# Patient Record
Sex: Male | Born: 1935 | Race: Black or African American | Hispanic: No | Smoking: Never smoker
Health system: Southern US, Community
[De-identification: ages and names within clinical notes are randomized; demographics above are authoritative.]

## PROBLEM LIST (undated history)

## (undated) DIAGNOSIS — F0390 Unspecified dementia without behavioral disturbance: Secondary | ICD-10-CM

## (undated) DIAGNOSIS — F0280 Dementia in other diseases classified elsewhere without behavioral disturbance: Secondary | ICD-10-CM

## (undated) DIAGNOSIS — G20 Parkinson's disease: Secondary | ICD-10-CM

## (undated) DIAGNOSIS — E119 Type 2 diabetes mellitus without complications: Secondary | ICD-10-CM

## (undated) DIAGNOSIS — N429 Disorder of prostate, unspecified: Secondary | ICD-10-CM

## (undated) DIAGNOSIS — R569 Unspecified convulsions: Secondary | ICD-10-CM

---

## 2006-06-21 ENCOUNTER — Inpatient Hospital Stay (HOSPITAL_COMMUNITY): Admission: EM | Admit: 2006-06-21 | Discharge: 2006-06-25 | Payer: Self-pay | Admitting: Emergency Medicine

## 2010-09-11 ENCOUNTER — Encounter: Payer: Self-pay | Admitting: Internal Medicine

## 2019-10-06 ENCOUNTER — Emergency Department (HOSPITAL_COMMUNITY): Payer: Medicare Other

## 2019-10-06 ENCOUNTER — Emergency Department (HOSPITAL_COMMUNITY)
Admission: EM | Admit: 2019-10-06 | Discharge: 2019-10-06 | Disposition: A | Discharge status: Home / Self Care | Payer: Medicare Other | Attending: Emergency Medicine | Admitting: Emergency Medicine

## 2019-10-06 ENCOUNTER — Encounter (HOSPITAL_COMMUNITY): Payer: Self-pay | Admitting: *Deleted

## 2019-10-06 ENCOUNTER — Other Ambulatory Visit: Payer: Self-pay

## 2019-10-06 DIAGNOSIS — S79911A Unspecified injury of right hip, initial encounter: Secondary | ICD-10-CM | POA: Diagnosis present

## 2019-10-06 DIAGNOSIS — R0789 Other chest pain: Secondary | ICD-10-CM | POA: Insufficient documentation

## 2019-10-06 DIAGNOSIS — Y999 Unspecified external cause status: Secondary | ICD-10-CM | POA: Diagnosis not present

## 2019-10-06 DIAGNOSIS — Z7984 Long term (current) use of oral hypoglycemic drugs: Secondary | ICD-10-CM | POA: Diagnosis not present

## 2019-10-06 DIAGNOSIS — G20 Parkinson's disease: Secondary | ICD-10-CM | POA: Diagnosis not present

## 2019-10-06 DIAGNOSIS — S7001XA Contusion of right hip, initial encounter: Secondary | ICD-10-CM | POA: Insufficient documentation

## 2019-10-06 DIAGNOSIS — W19XXXA Unspecified fall, initial encounter: Secondary | ICD-10-CM | POA: Insufficient documentation

## 2019-10-06 DIAGNOSIS — Y9389 Activity, other specified: Secondary | ICD-10-CM | POA: Diagnosis not present

## 2019-10-06 DIAGNOSIS — G309 Alzheimer's disease, unspecified: Secondary | ICD-10-CM | POA: Diagnosis not present

## 2019-10-06 DIAGNOSIS — M25551 Pain in right hip: Secondary | ICD-10-CM

## 2019-10-06 DIAGNOSIS — F0280 Dementia in other diseases classified elsewhere without behavioral disturbance: Secondary | ICD-10-CM | POA: Diagnosis not present

## 2019-10-06 DIAGNOSIS — Y9289 Other specified places as the place of occurrence of the external cause: Secondary | ICD-10-CM | POA: Insufficient documentation

## 2019-10-06 HISTORY — DX: Type 2 diabetes mellitus without complications: E11.9

## 2019-10-06 HISTORY — DX: Disorder of prostate, unspecified: N42.9

## 2019-10-06 HISTORY — DX: Parkinson's disease: G20

## 2019-10-06 HISTORY — DX: Dementia in other diseases classified elsewhere, unspecified severity, without behavioral disturbance, psychotic disturbance, mood disturbance, and anxiety: F02.80

## 2019-10-06 HISTORY — DX: Unspecified convulsions: R56.9

## 2019-10-06 HISTORY — DX: Unspecified dementia, unspecified severity, without behavioral disturbance, psychotic disturbance, mood disturbance, and anxiety: F03.90

## 2019-10-06 NOTE — ED Provider Notes (Signed)
[facility] EMERGENCY DEPARTMENT Provider Note   CSN: [id number] Arrival date & time: 10/06/19  1242     History Chief Complaint  Patient presents with  . Hip Pain    Reon Hunley is a 86 y.o. male.  Patient states he tripped and fell on his right side hurts in his right lower chest and his right hip  The history is provided by the patient. No language interpreter was used.  Hip Pain This is a new problem. The current episode started 12 to 24 hours ago. The problem occurs rarely. The problem has been resolved. Associated symptoms include chest pain. Pertinent negatives include no abdominal pain and no headaches. Nothing aggravates the symptoms. Nothing relieves the symptoms.       Past Medical History:  Diagnosis Date  . Alzheimer disease (HCC)   . Dementia (HCC)   . Diabetes mellitus without complication (HCC)   . Parkinson disease (HCC)   . Prostate disease   . Seizures (HCC)     There are no problems to display for this patient.   Past Surgical History:  Procedure Laterality Date  . HERNIA REPAIR    . urological surgery         No family history on file.  Social History   Tobacco Use  . Smoking status: Never Smoker  . Smokeless tobacco: Never Used  Substance Use Topics  . Alcohol use: Not Currently  . Drug use: Never    Home Medications Prior to Admission medications   Medication Sig Start Date End Date Taking? Authorizing Provider  cetirizine (ZYRTEC) 5 MG tablet Take 5 mg by mouth at bedtime. 09/18/19  Yes [provider]  divalproex (DEPAKOTE ER) 500 MG 24 hr tablet Take 1 tablet by mouth at bedtime.  09/30/19  Yes [provider]  finasteride (PROSCAR) 5 MG tablet Take 5 mg by mouth daily. 09/18/19  Yes [provider]  JANUVIA 50 MG tablet Take 50 mg by mouth daily. 06/15/19  Yes [provider]  lamoTRIgine (LAMICTAL) 100 MG tablet Take 1 tablet by mouth in the morning and at bedtime. 10/04/19  Yes [provider]  levothyroxine (SYNTHROID) 25 MCG tablet Take 12.5 mcg by mouth daily. 10/01/19  Yes [provider]  Melatonin 1 MG TABS Take 4 tablets by mouth daily. 08/19/19  Yes [provider]  meloxicam (MOBIC) 15 MG tablet Take 15 mg by mouth daily. 09/27/19  Yes [provider]  methylPREDNISolone (MEDROL DOSEPAK) 4 MG TBPK tablet Take 1 tablet by mouth as directed. 09/15/19  Yes [provider]  Multiple Vitamin (MULTIVITAMIN) capsule Take 1 capsule by mouth daily.   Yes [provider]  omega-3 acid ethyl esters (LOVAZA) 1 g capsule Take 1 g by mouth 2 (two) times daily.   Yes [provider]  Omega-3 Fatty Acids (FISH OIL) 1000 MG CAPS Take 1 capsule by mouth daily.   Yes [provider]  QUEtiapine (SEROQUEL) 25 MG tablet Take 1 tablet by mouth at bedtime as needed.  09/07/19  Yes [provider]  tamsulosin (FLOMAX) 0.4 MG CAPS capsule Take 0.4 mg by mouth daily. 10/02/19  Yes [provider]    Allergies    Lovastatin and Procardia [nifedipine]  Review of Systems   Review of Systems  Constitutional: Negative for appetite change and fatigue.  HENT: Negative for congestion, ear discharge and sinus pressure.   Eyes: Negative for discharge.  Respiratory: Negative for cough.   Cardiovascular: Positive  for chest pain.  Gastrointestinal: Negative for abdominal pain and diarrhea.  Genitourinary: Negative for frequency and hematuria.  Musculoskeletal: Negative for back pain.       Tenderness to right hip with bruising  Skin: Negative for rash.  Neurological: Negative for seizures and headaches.  Psychiatric/Behavioral: Negative for hallucinations.    Physical Exam Updated Vital Signs BP (!) 183/87 (BP Location: Right Arm)   Pulse 78   Temp 98.6 F (37 C) (Oral)   Resp 14   SpO2 96%   Physical Exam Vitals and nursing note reviewed.  Constitutional:      Appearance: He is well-developed.  HENT:       Head: Normocephalic.     Nose: Nose normal.  Eyes:     General: No scleral icterus.    Conjunctiva/sclera: Conjunctivae normal.  Neck:     Thyroid: No thyromegaly.  Cardiovascular:     Rate and Rhythm: Normal rate and regular rhythm.     Heart sounds: No murmur. No friction rub. No gallop.      Comments: Tender right lower chest Pulmonary:     Breath sounds: No stridor. No wheezing or rales.  Chest:     Chest wall: No tenderness.  Abdominal:     General: There is no distension.     Tenderness: There is no abdominal tenderness. There is no rebound.  Musculoskeletal:        General: Normal range of motion.     Cervical back: Neck supple.     Comments: Tender right hip with bruising  Lymphadenopathy:     Cervical: No cervical adenopathy.  Skin:    Findings: No erythema or rash.  Neurological:     Mental Status: He is alert and oriented to person, place, and time.     Motor: No abnormal muscle tone.     Coordination: Coordination normal.  Psychiatric:        Behavior: Behavior normal.     ED Results / Procedures / Treatments   Labs (all labs ordered are listed, but only abnormal results are displayed) Labs Reviewed - No data to display  EKG None  Radiology DG Ribs Unilateral W/Chest Right  Result Date: 10/06/2019 CLINICAL DATA:  Larey Seat 2 weeks ago, fell 4 days ago with a repetitive injury in same area EXAM: RIGHT RIBS AND CHEST - 3+ VIEW COMPARISON:  06/22/2006 FINDINGS: Frontal view of the chest as well as oblique views of the right thoracic cage are obtained. There are no acute displaced rib fractures. The cardiac silhouette is unremarkable. Marked ectasia of the thoracic aorta is again noted, with cardiomediastinal contours accentuated by AP supine positioning. Scarring or atelectasis at the right base. No airspace disease, effusion, or pneumothorax. IMPRESSION: 1. No acute intrathoracic process. 2. Chronic tortuosity of the thoracic aorta, not appreciably changed  since 2007. Electronically Signed   By: Sharlet Salina M.D.   On: 10/06/2019 16:56   CT Hip Right Wo Contrast  Result Date: 10/06/2019 CLINICAL DATA:  Right hip pain for 2 weeks since a fall. Initial encounter. EXAM: CT OF THE RIGHT HIP WITHOUT CONTRAST TECHNIQUE: Multidetector CT imaging of the right hip was performed according to the standard protocol. Multiplanar CT image reconstructions were also generated. COMPARISON:  Plain films earlier today. FINDINGS: Bones/Joint/Cartilage There is no acute bony or joint abnormality. Joint space narrowing, subchondral cyst formation in the periphery of the acetabulum and small osteophytes about the humeral head noted. No avascular necrosis of the femoral head. Lower lumbar  spondylosis and degenerative ankylosis of the right SI joint noted. Ligaments Suboptimally assessed by CT. Muscles and Tendons Negative Soft tissues There is a mixed attenuating fluid collection adjacent to the right greater trochanter which measures 9 cm AP x 4 cm transverse x 10 cm craniocaudal consistent with a subacute hematoma. There is some stranding in subcutaneous fat about the collection. IMPRESSION: Negative for fracture or other acute bony or joint abnormality. Large subacute appearing hematoma in the subcutaneous fat adjacent to the right greater trochanter. Right hip osteoarthritis. Electronically Signed   By: Inge Rise M.D.   On: 10/06/2019 16:44   DG Hip Unilat  With Pelvis 2-3 Views Right  Result Date: 10/06/2019 CLINICAL DATA:  Right hip pain after a fall 2 weeks ago. Second fall 4 days ago, initial encounter. EXAM: DG HIP (WITH OR WITHOUT PELVIS) 2-3V RIGHT COMPARISON:  None. FINDINGS: No acute osseous or joint abnormality. Superolateral joint space narrowing in the right hip with osteophytosis. Degenerative changes are seen in the lumbar spine. IMPRESSION: 1. No acute findings. 2. Mild osteoarthritis in the right hip. Electronically Signed   By: Lorin Picket M.D.   On:  10/06/2019 14:02    Procedures Procedures (including critical care time)  Medications Ordered in ED Medications - No data to display  ED Course  I have reviewed the triage vital signs and the nursing notes.  Pertinent labs & imaging results that were available during my care of the patient were reviewed by me and considered in my medical decision making (see chart for details).    MDM Rules/Calculators/A&P                     Chest x-ray and CT scan of the right hip do not show any fractures.  Patient has a large hematoma around his right hip.  He will follow up with orthopedics next week Final Clinical Impression(s) / ED Diagnoses Final diagnoses:  Right hip pain    Rx / DC Orders ED Discharge Orders    None       Milton Ferguson, MD 10/06/19 1713

## 2019-10-06 NOTE — ED Triage Notes (Signed)
Fell 2 weeks ago and injured hip and had negative xrays at that time, fell again 4 days ago and re-injured the same area. Pain in right hip.

## 2019-10-06 NOTE — Discharge Instructions (Signed)
Take Tylenol for pain.  Follow-up with an orthopedic surgeon in the next week

## 2021-05-10 IMAGING — DX DG HIP (WITH OR WITHOUT PELVIS) 2-3V*R*
3 series · 3 of 3 positions shown · non-contrast
Comparison: None.

CLINICAL DATA: Right hip pain after a fall 2 weeks ago. Second fall
4 days ago, initial encounter.

EXAM:
DG HIP (WITH OR WITHOUT PELVIS) 2-3V RIGHT

[pelvis ap]
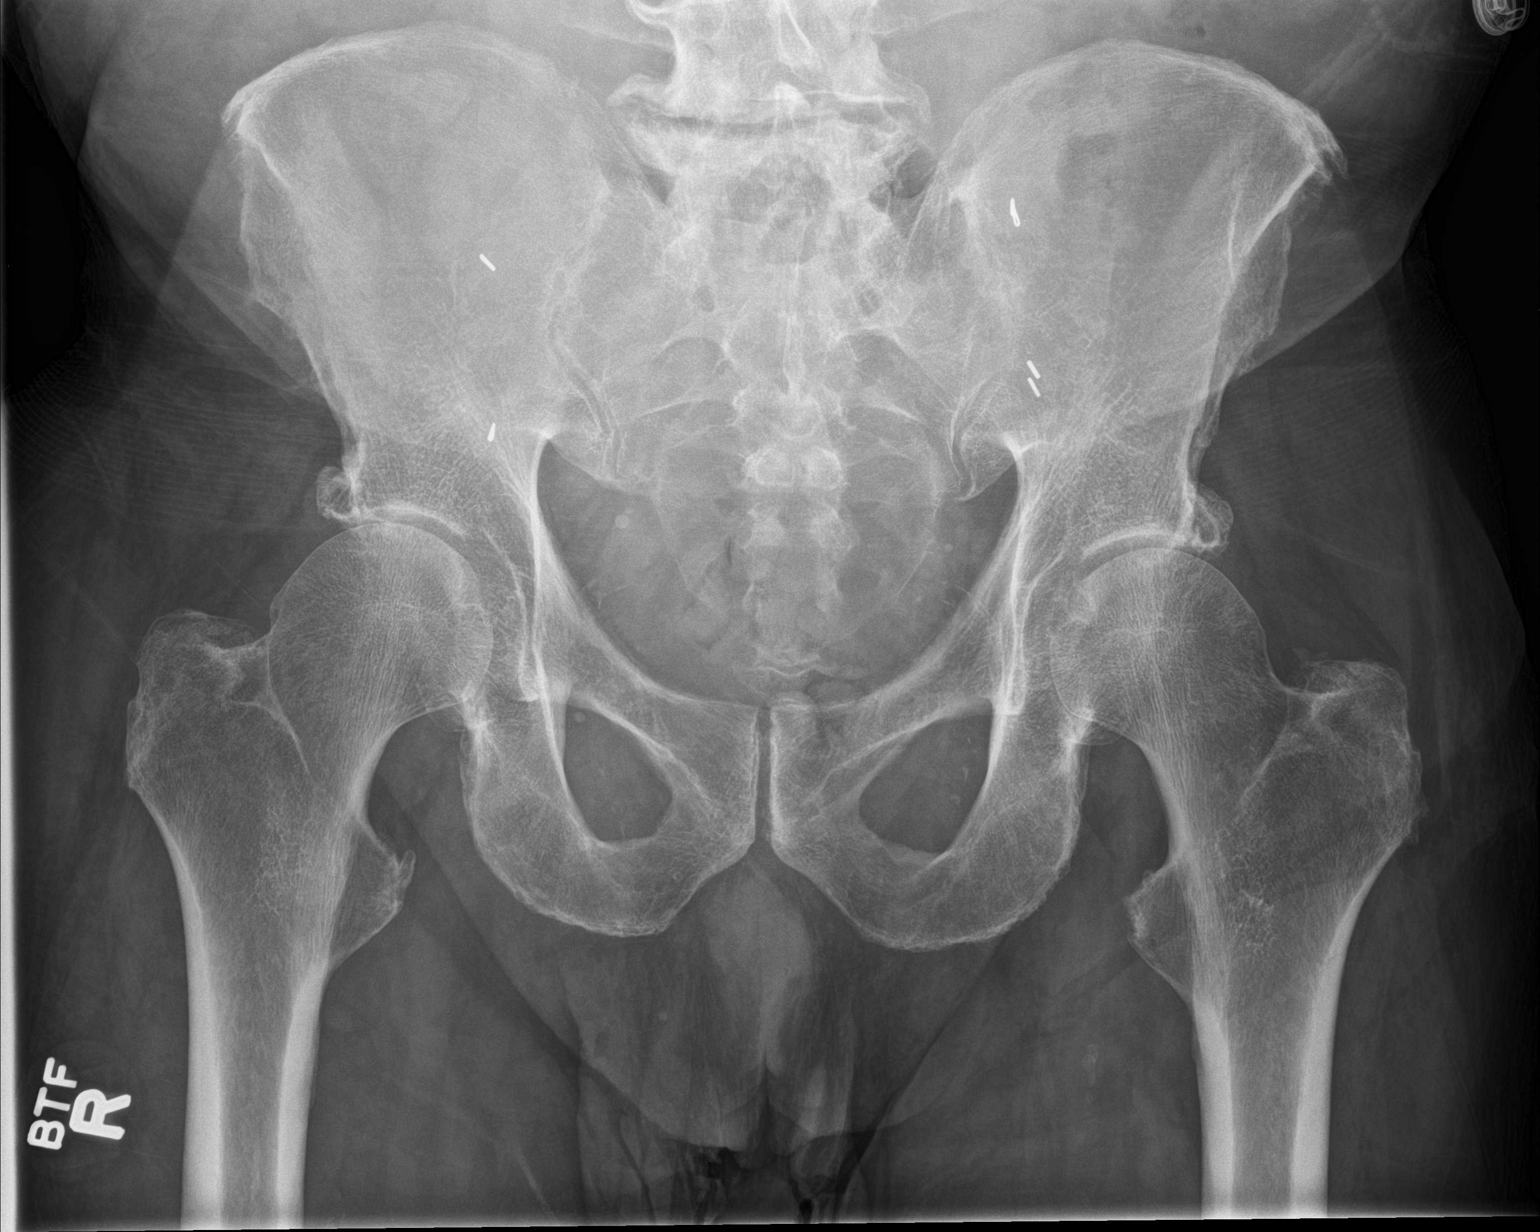

[hip ap]
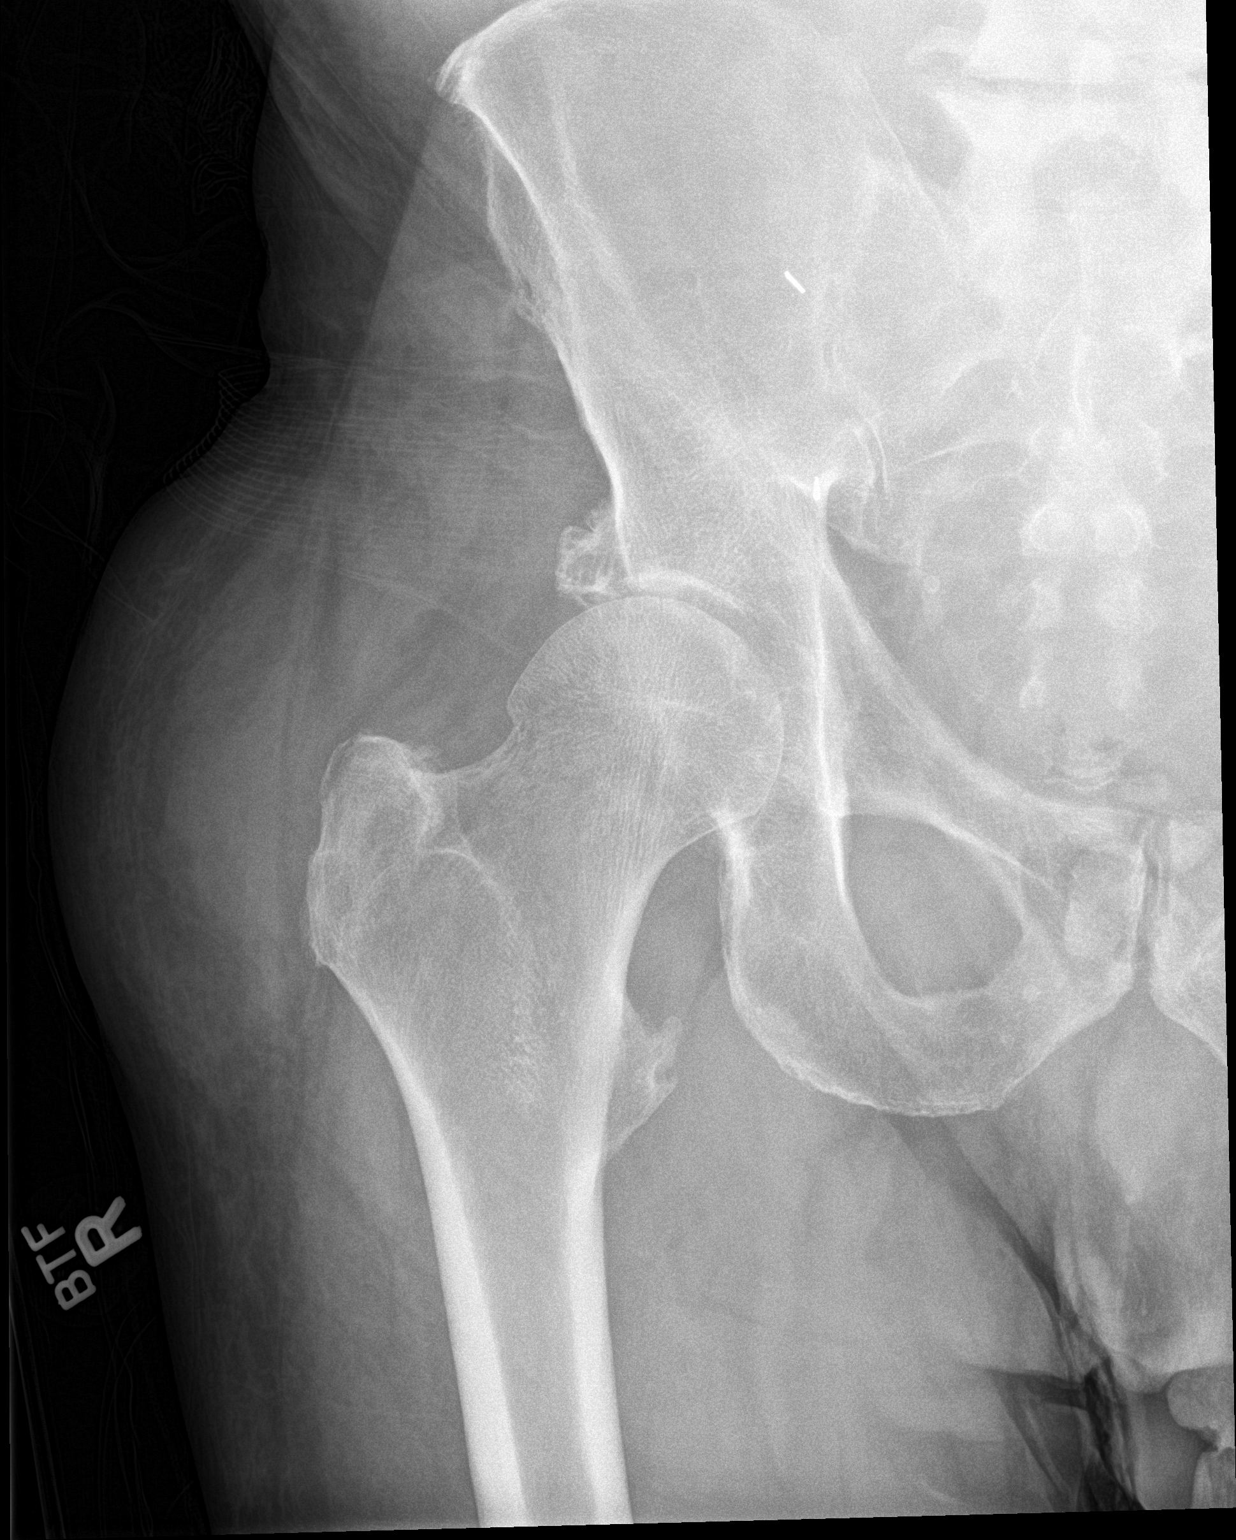

[hip lat]
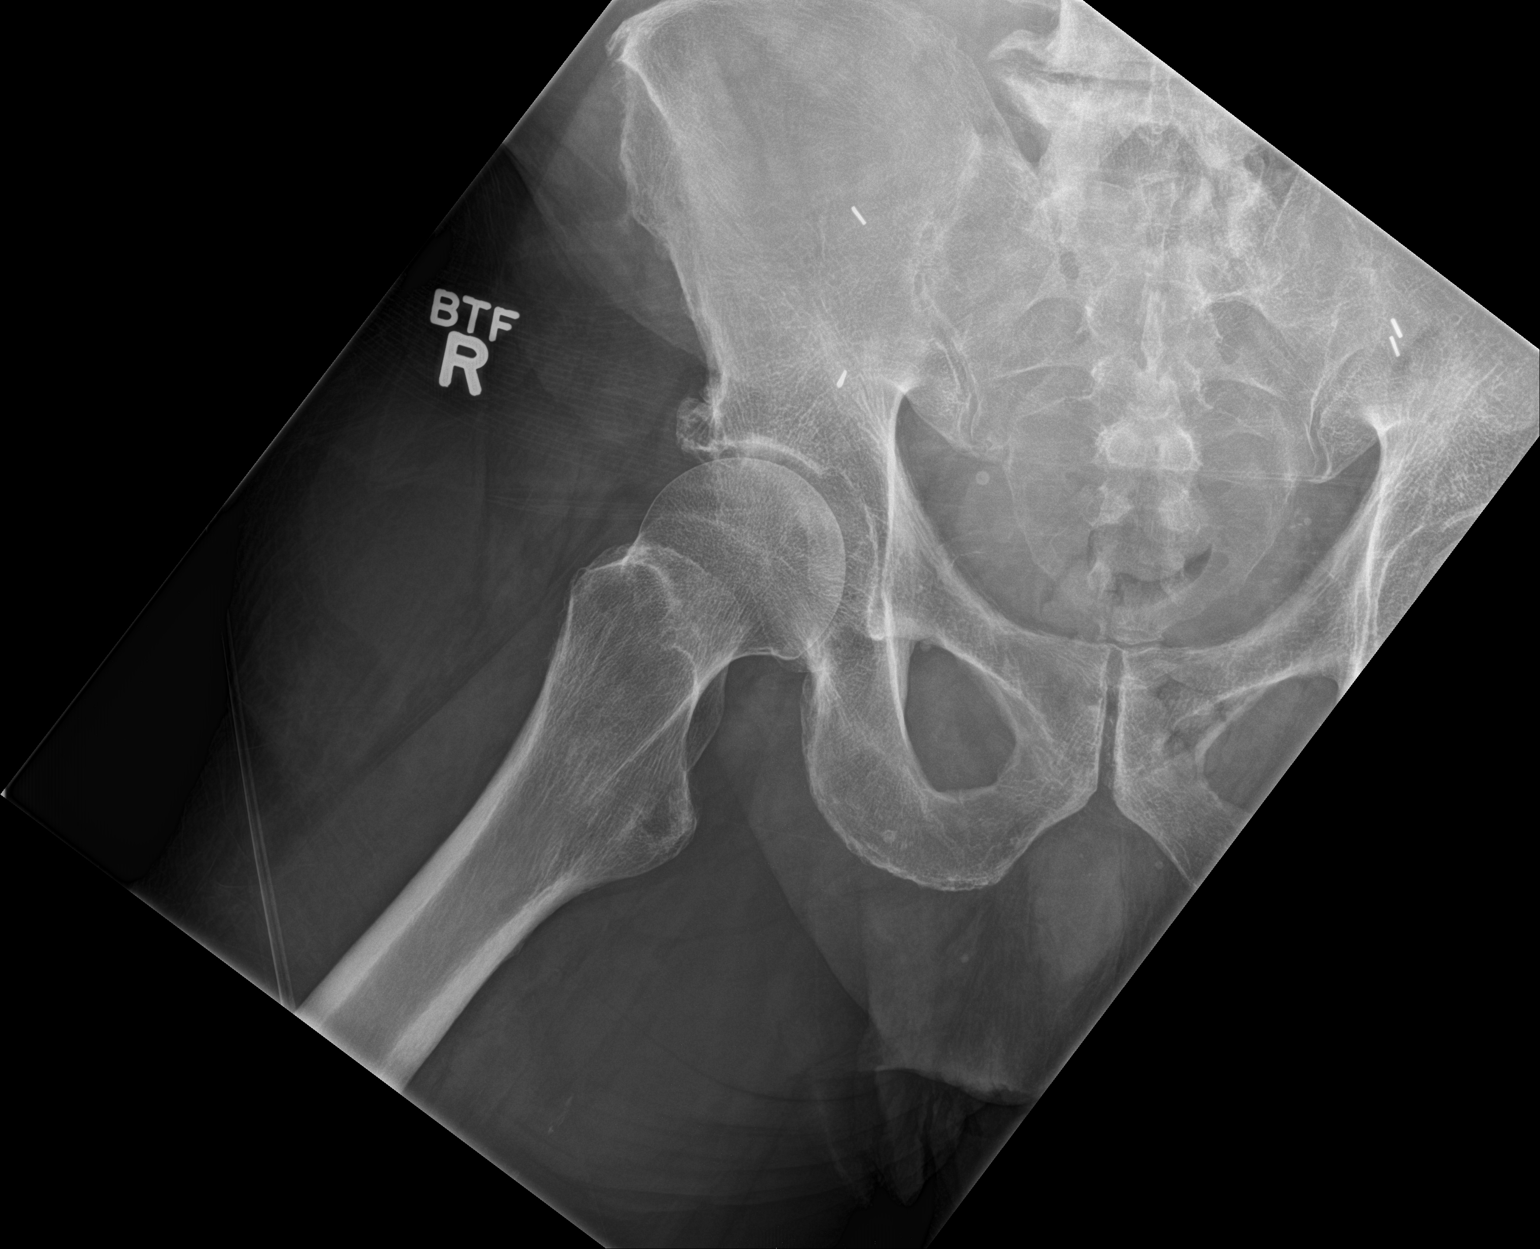

[3 of 3 positions shown; findings below may reference images not displayed]

FINDINGS: No acute osseous or joint abnormality. Superolateral joint space
narrowing in the right hip with osteophytosis. Degenerative changes
are seen in the lumbar spine.
IMPRESSION: 1. No acute findings.
2. Mild osteoarthritis in the right hip.

## 2023-11-20 DEATH — deceased
